# Patient Record
Sex: Female | Born: 1975 | Race: Black or African American | Hispanic: No | Marital: Single | Smoking: Never smoker
Health system: Southern US, Community
[De-identification: ages and names within clinical notes are randomized; demographics above are authoritative.]

## PROBLEM LIST (undated history)

## (undated) DIAGNOSIS — E119 Type 2 diabetes mellitus without complications: Secondary | ICD-10-CM

## (undated) DIAGNOSIS — D509 Iron deficiency anemia, unspecified: Secondary | ICD-10-CM

## (undated) DIAGNOSIS — G5600 Carpal tunnel syndrome, unspecified upper limb: Secondary | ICD-10-CM

## (undated) DIAGNOSIS — R202 Paresthesia of skin: Secondary | ICD-10-CM

## (undated) HISTORY — DX: Type 2 diabetes mellitus without complications: E11.9

## (undated) HISTORY — DX: Iron deficiency anemia, unspecified: D50.9

## (undated) HISTORY — DX: Paresthesia of skin: R20.2

---

## 2002-09-27 HISTORY — PX: TUBAL LIGATION: SHX77

## 2006-04-06 ENCOUNTER — Emergency Department (HOSPITAL_COMMUNITY): Admission: EM | Admit: 2006-04-06 | Discharge: 2006-04-06 | Payer: Self-pay | Admitting: Emergency Medicine

## 2006-04-07 ENCOUNTER — Emergency Department (HOSPITAL_COMMUNITY): Admission: EM | Admit: 2006-04-07 | Discharge: 2006-04-07 | Payer: Self-pay | Admitting: Emergency Medicine

## 2006-04-09 ENCOUNTER — Emergency Department (HOSPITAL_COMMUNITY): Admission: EM | Admit: 2006-04-09 | Discharge: 2006-04-09 | Payer: Self-pay | Admitting: Family Medicine

## 2007-03-27 ENCOUNTER — Emergency Department (HOSPITAL_COMMUNITY): Admission: EM | Admit: 2007-03-27 | Discharge: 2007-03-27 | Payer: Self-pay | Admitting: Emergency Medicine

## 2007-04-05 ENCOUNTER — Emergency Department (HOSPITAL_COMMUNITY): Admission: EM | Admit: 2007-04-05 | Discharge: 2007-04-05 | Payer: Self-pay | Admitting: Emergency Medicine

## 2007-07-31 ENCOUNTER — Emergency Department (HOSPITAL_COMMUNITY): Admission: EM | Admit: 2007-07-31 | Discharge: 2007-07-31 | Payer: Self-pay | Admitting: Emergency Medicine

## 2008-03-04 ENCOUNTER — Emergency Department (HOSPITAL_COMMUNITY): Admission: EM | Admit: 2008-03-04 | Discharge: 2008-03-04 | Payer: Self-pay | Admitting: Emergency Medicine

## 2008-06-12 ENCOUNTER — Emergency Department (HOSPITAL_COMMUNITY): Admission: EM | Admit: 2008-06-12 | Discharge: 2008-06-12 | Payer: Self-pay | Admitting: Emergency Medicine

## 2011-06-28 LAB — RAPID STREP SCREEN (MED CTR MEBANE ONLY): Streptococcus, Group A Screen (Direct): POSITIVE — AB

## 2011-07-06 LAB — COMPREHENSIVE METABOLIC PANEL
ALT: 13
BUN: 9
CO2: 27
GFR calc Af Amer: >60
GFR calc non Af Amer: >60
Glucose, Bld: 93
Potassium: 4.2
Total Bilirubin: 0.4

## 2011-07-06 LAB — URINALYSIS, ROUTINE W REFLEX MICROSCOPIC
Bilirubin Urine: NEGATIVE
Glucose, UA: NEGATIVE
Nitrite: NEGATIVE
Urobilinogen, UA: 0.2
pH: 5

## 2011-07-06 LAB — DIFFERENTIAL
Basophils Relative: 1
Eosinophils Absolute: 0.3
Eosinophils Relative: 3
Lymphs Abs: 2
Monocytes Absolute: 0.9 — ABNORMAL HIGH
Monocytes Relative: 11
Neutrophils Relative %: 64

## 2011-07-06 LAB — URINE MICROSCOPIC-ADD ON

## 2011-07-06 LAB — CBC
HCT: 38.9
Hemoglobin: 13.1
MCV: 79.8

## 2011-07-06 LAB — LIPASE, BLOOD: Lipase: 24

## 2016-04-09 ENCOUNTER — Encounter: Payer: Self-pay | Admitting: Neurology

## 2016-04-09 ENCOUNTER — Ambulatory Visit (INDEPENDENT_AMBULATORY_CARE_PROVIDER_SITE_OTHER): Payer: Managed Care, Other (non HMO) | Admitting: Neurology

## 2016-04-09 VITALS — BP 110/80 | HR 74 | Ht 65.0 in | Wt 236.1 lb

## 2016-04-09 DIAGNOSIS — R202 Paresthesia of skin: Secondary | ICD-10-CM | POA: Diagnosis not present

## 2016-04-09 DIAGNOSIS — M79631 Pain in right forearm: Secondary | ICD-10-CM | POA: Diagnosis not present

## 2016-04-09 DIAGNOSIS — M79632 Pain in left forearm: Secondary | ICD-10-CM | POA: Diagnosis not present

## 2016-04-09 MED ORDER — GABAPENTIN 300 MG PO CAPS: ORAL_CAPSULE | ORAL | Status: DC

## 2016-04-09 NOTE — Patient Instructions (Addendum)
1.  Start gabapentin 300mg  at bedtime x 3 then increase to 1 tablet twice daily 2.  EMG of the arms.  We will call you with the results 3.  Return to clinic in 3 months

## 2016-04-09 NOTE — Progress Notes (Signed)
Note routed

## 2016-04-09 NOTE — Progress Notes (Signed)
[facility] Neurology [facility] Clinic Note - Initial Visit   Date: 04/09/2016  Kathryn Hayden MRN: [id number] DOB: 09-Jan-1976   Dear Dr. Kateri PlummerMorrow:  Thank you for your kind referral of Kathryn Hayden for consultation of bilateral hand paresthesias. Although her history is well known to you, please allow us to reiterate it for the purpose of our medical record. The patient was accompanied to the clinic by self.   History of Present Illness: Kathryn Hayden is a 40 y.o. right-handed African American female with iron deficient anemia presenting for evaluation of bilateral hand paresthesias.    Starting in April 2017, she developed right sided shoulder pain, which was achy at first and then developed stinging pain.  She then developed numbness over the hands and tingling over the forearm which is worse at night time which often wakes her up from sleeping.   Her right hand locked up on one occasion which prompted her to see her PCP.  She was given tramadol and flexeril which helped her pain in her shoulder.   Currently, she has bilateral arm and hand pain and numbness.  She does not have any weakness or neck pain.  She works as a Clinical biochemistCMA.  She also complains of left ankle swelling and left knee pain.   Out-side paper records, electronic medical record, and images have been reviewed where available and summarized as:  Labs 01/2016: vitamin B12 540  Past Medical History  Diagnosis Date  . Paresthesias   . Iron deficiency anemia     No past surgical history on file.   Medications:  Outpatient Encounter Prescriptions as of 04/09/2016  Medication Sig Note  . clobetasol ointment (TEMOVATE) 0.05 % APPLY ON SKIN DAILY 04/09/2016: Received from: External Pharmacy  . cyclobenzaprine (FLEXERIL) 5 MG tablet take 1 tablet by mouth three times a day if needed 04/09/2016: Received from: External Pharmacy  . IRON PO Take 65 mg by mouth.   . traMADol (ULTRAM) 50 MG tablet Take 50 mg by mouth 2  (two) times daily. 04/09/2016: Received from: External Pharmacy  . triamcinolone cream (KENALOG) 0.1 % apply to affected area twice a day 04/09/2016: Received from: External Pharmacy   No facility-administered encounter medications on file as of 04/09/2016.     Allergies: No Known Allergies  Family History: Family History  Problem Relation Age of Onset  . Diabetes Father   . High blood pressure Mother   . High blood pressure Father     Social History: Social History  Substance Use Topics  . Smoking status: Never Smoker   . Smokeless tobacco: Never Used  . Alcohol Use: 0.0 oz/week    0 Standard drinks or equivalent per week   Social History   Social History Narrative   Lives with 2 daughters in a 3 story home.  Has 3 daughters.  Works as a LawyerCNA.  Education: some college.    Review of Systems:  CONSTITUTIONAL: No fevers, chills, night sweats, or weight loss.   EYES: No visual changes or eye pain ENT: No hearing changes.  No history of nose bleeds.   RESPIRATORY: No cough, wheezing and shortness of breath.   CARDIOVASCULAR: Negative for chest pain, and palpitations.   GI: Negative for abdominal discomfort, blood in stools or black stools.  No recent change in bowel habits.   GU:  No history of incontinence.   MUSCLOSKELETAL: No history of joint pain or swelling.  No myalgias.   SKIN: Negative for lesions, rash, and  itching.   HEMATOLOGY/ONCOLOGY: Negative for prolonged bleeding, bruising easily, and swollen nodes.  No history of cancer.   ENDOCRINE: Negative for cold or heat intolerance, polydipsia or goiter.   PSYCH:  No depression or anxiety symptoms.   NEURO: As Above.   Vital Signs:  BP 110/80 mmHg  Pulse 74  Ht  (1.651 m)  Wt 236 lb 2 oz (107.106 kg)  BMI 39.29 kg/m2  SpO2 97% Pain Scale: 0 on a scale of 0-10   General Medical Exam:   General:  Well appearing, comfortable.   Eyes/ENT: seecranial nerve examination.   Neck: No masses appreciated.  Full  range of motion without tenderness.  No carotid bruits. Respiratory:  Clear to auscultation, good air entry bilaterally.   Cardiac:  Regular rate and rhythm, no murmur.   Extremities:  No deformities, edema, or skin discoloration.  Skin:  No rashes or lesions.  Neurological Exam: MENTAL STATUS including orientation to time, place, person, recent and remote memory, attention span and concentration, language, and fund of knowledge is normal.  Speech is not dysarthric.  CRANIAL NERVES: II:  No visual field defects.  Unremarkable fundi.   III-IV-VI: Pupils equal round and reactive to light.  Normal conjugate, extra-ocular eye movements in all directions of gaze.  No nystagmus.  No ptosis.   V:  Normal facial sensation.   VII:  Normal facial symmetry and movements. VIII:  Normal hearing and vestibular function.   IX-X:  Normal palatal movement.   XI:  Normal shoulder shrug and head rotation.   XII:  Normal tongue strength and range of motion, no deviation or fasciculation.  MOTOR: She has tenderness over the right forearm extensor tendons.  No atrophy, fasciculations or abnormal movements.  No pronator drift.  Tone is normal.    Right Upper Extremity:    Left Upper Extremity:    Deltoid  5/5   Deltoid  5/5   Biceps  5/5   Biceps  5/5   Triceps  5/5   Triceps  5/5   Wrist extensors  5/5   Wrist extensors  5/5   Wrist flexors  5/5   Wrist flexors  5/5   Finger extensors  5/5   Finger extensors  5/5   Finger flexors  5/5   Finger flexors  5/5   Dorsal interossei  5-/5   Dorsal interossei  5-/5   Abductor pollicis  5/5   Abductor pollicis  5/5   Tone (Ashworth scale)  0  Tone (Ashworth scale)  0   Right Lower Extremity:    Left Lower Extremity:    Hip flexors  5/5   Hip flexors  5/5   Hip extensors  5/5   Hip extensors  5/5   Knee flexors  5/5   Knee flexors  5/5   Knee extensors  5/5   Knee extensors  5/5   Dorsiflexors  5/5   Dorsiflexors  5/5   Plantarflexors  5/5   Plantarflexors   5/5   Toe extensors  5/5   Toe extensors  5/5   Toe flexors  5/5   Toe flexors  5/5   Tone (Ashworth scale)  0  Tone (Ashworth scale)  0   MSRs:  Reflexes are 2+/4 throughout.  Plantars are down going.  SENSORY:  Reduced pin prick over the right index and middle finger, otherwise normal and symmetric perception of light touch, pinprick, vibration, and proprioception.   COORDINATION/GAIT: Normal finger-to- nose-finger.  Intact rapid  alternating movements bilaterally. Gait narrow based and stable. Tandem and stressed gait intact.    IMPRESSION/PLAN: Kathryn Hayden is a 40 year-old female referred for evaluation of bilateral arm and hand pain.  Her exam shows mild intrinsic hand weakness and pain triggered by joint movement.  Sensation is reduced over the median distribution on the right, otherwise normal.  Reflexes are also normal.  Although she may very well have carpal tunnel syndrome, it would not explain her proximal forearm pain and paresthesias.  Cervical radiculopathy is possible and NCS/EMG will be ordered to better characterize the nature of her symptoms.  The extreme tenderness of her forearm also suggests possible tendonitis or myalgia.  Her EMG will be telling. In the meantime, gabapentin 300mg  at bedtime x 3 days, then 300mg  twice daily will be started for symptom management.  Return to clinic in 3 months.  The duration of this appointment visit was 40 minutes of face-to-face time with the patient.  Greater than 50% of this time was spent in counseling, explanation of diagnosis, planning of further management, and coordination of care.   Thank you for allowing me to participate in patient's care.  If I can answer any additional questions, I would be pleased to do so.    Sincerely,    Donika K. Allena Katz, DO

## 2016-04-20 ENCOUNTER — Ambulatory Visit (INDEPENDENT_AMBULATORY_CARE_PROVIDER_SITE_OTHER): Payer: Managed Care, Other (non HMO) | Admitting: Neurology

## 2016-04-20 DIAGNOSIS — G5603 Carpal tunnel syndrome, bilateral upper limbs: Secondary | ICD-10-CM

## 2016-04-20 DIAGNOSIS — M79632 Pain in left forearm: Secondary | ICD-10-CM

## 2016-04-20 DIAGNOSIS — R202 Paresthesia of skin: Secondary | ICD-10-CM

## 2016-04-20 DIAGNOSIS — M79631 Pain in right forearm: Secondary | ICD-10-CM

## 2016-04-20 NOTE — Procedures (Addendum)
[facility] Neurology  [address] [place], [address]  [place], [place] [address] Tel: [phone] Fax:  [phone] Test Date:  04/20/2016  Patient: Kathryn Hayden DOB: August 05, 1976 Physician: Nita Sickle, DO  Sex: Female Height: 5\' 5"  Ref Phys: Nita Sickle, DO  ID#: [id number] Temp: 33.2C Technician: Judie Petit. Dean   Patient Complaints: This is a 40 year old female referred for evaluation of bilateral hand numbness and tingling.  NCV & EMG Findings: Extensive electrodiagnostic testing of the right upper extremity and additional studies of the left shows: 1. Bilateral median sensory responses show prolonged latency (R4.2, L4.9 ms) and left side also shows reduced amplitude (19.1 V).  Bilateral ulnar sensory responses are within normal limits. 2. Bilateral median motor responses show prolonged distal onset latency (R4.5, L5.0.5 ms).  Bilateral ulnar motor responses are within normal limits. 3. Sparse chronic motor axon loss changes were isolated to the left abductor pollicis brevis muscle, without accompanied active denervation.    Impression: 1. Bilateral median neuropathy, at or distal to the wrist, consistent with clinical diagnosis of carpal tunnel syndrome. Overall, these findings are moderate in degree electrically, and worse on the left. 2. There is no evidence of a cervical radiculopathy affecting the upper extremities.   ___________________________ Nita Sickle, DO    Nerve Conduction Studies Anti Sensory Summary Table   Site NR Peak (ms) Norm Peak (ms) P-T Amp (V) Norm P-T Amp  Left Median Anti Sensory (2nd Digit)  Wrist    4.9 <3.4 19.1 >20  Right Median Anti Sensory (2nd Digit)  Wrist    4.2 <3.4 21.4 >20  Left Ulnar Anti Sensory (5th Digit)  Wrist    2.8 <3.1 33.3 >12  Right Ulnar Anti Sensory (5th Digit)  Wrist    2.5 <3.1 36.4 >12   Motor Summary Table   Site NR Onset (ms) Norm Onset (ms) O-P Amp (mV) Norm O-P Amp Site1 Site2 Delta-0 (ms) Dist (cm) Vel (m/s) Norm  Vel (m/s)  Left Median Motor (Abd Poll Brev)  Wrist    5.0 <3.9 11.3 >6 Elbow Wrist 4.3 25.0 58 >50  Elbow    9.3  11.2         Right Median Motor (Abd Poll Brev)  Wrist    4.5 <3.9 11.7 >6 Elbow Wrist 4.3 25.0 58 >50  Elbow    8.8  10.9         Left Ulnar Motor (Abd Dig Minimi)  Wrist    2.4 <3.1 9.2 >7 B Elbow Wrist 3.6 21.0 58 >50  B Elbow    6.0  8.0  A Elbow B Elbow 1.5 10.0 67 >50  A Elbow    7.5  7.7         Right Ulnar Motor (Abd Dig Minimi)  Wrist    2.7 <3.1 9.2 >7 B Elbow Wrist 3.6 20.0 56 >50  B Elbow    6.3  9.0  A Elbow B Elbow 1.6 10.0 62 >50  A Elbow    7.9  8.9          EMG   Side Muscle Ins Act Fibs Psw Fasc Number Recrt Dur Dur. Amp Amp. Poly Poly. Comment  Right 1stDorInt Nml Nml Nml Nml Nml Nml Nml Nml Nml Nml Nml Nml N/A  Left Abd Poll Brev Nml Nml Nml Nml 1- Mod-R Few 1+ Few 1+ Nml Nml N/A  Right Abd Poll Brev Nml Nml Nml Nml Nml Nml Nml Nml Nml Nml Nml Nml N/A  Right  Ext Indicis Nml Nml Nml Nml Nml Nml Nml Nml Nml Nml Nml Nml N/A  Right PronatorTeres Nml Nml Nml Nml Nml Nml Nml Nml Nml Nml Nml Nml N/A  Right Biceps Nml Nml Nml Nml Nml Nml Nml Nml Nml Nml Nml Nml N/A  Right Triceps Nml Nml Nml Nml Nml Nml Nml Nml Nml Nml Nml Nml N/A  Right Deltoid Nml Nml Nml Nml Nml Nml Nml Nml Nml Nml Nml Nml N/A  Left 1stDorInt Nml Nml Nml Nml Nml Nml Nml Nml Nml Nml Nml Nml N/A  Left Ext Indicis Nml Nml Nml Nml Nml Nml Nml Nml Nml Nml Nml Nml N/A  Left PronatorTeres Nml Nml Nml Nml Nml Nml Nml Nml Nml Nml Nml Nml N/A  Left Biceps Nml Nml Nml Nml Nml Nml Nml Nml Nml Nml Nml Nml N/A  Left Triceps Nml Nml Nml Nml Nml Nml Nml Nml Nml Nml Nml Nml N/A  Left Deltoid Nml Nml Nml Nml Nml Nml Nml Nml Nml Nml Nml Nml N/A      Waveforms:

## 2016-04-20 NOTE — Progress Notes (Signed)
Done

## 2016-07-28 ENCOUNTER — Ambulatory Visit: Payer: Managed Care, Other (non HMO) | Admitting: Neurology

## 2016-08-16 ENCOUNTER — Encounter (HOSPITAL_COMMUNITY): Payer: Self-pay | Admitting: Emergency Medicine

## 2016-08-16 ENCOUNTER — Emergency Department (HOSPITAL_COMMUNITY)
Admission: EM | Admit: 2016-08-16 | Discharge: 2016-08-16 | Disposition: A | Discharge status: Home / Self Care | Payer: Managed Care, Other (non HMO) | Attending: Emergency Medicine | Admitting: Emergency Medicine

## 2016-08-16 DIAGNOSIS — Z5321 Procedure and treatment not carried out due to patient leaving prior to being seen by health care provider: Secondary | ICD-10-CM | POA: Diagnosis not present

## 2016-08-16 DIAGNOSIS — R0789 Other chest pain: Secondary | ICD-10-CM | POA: Insufficient documentation

## 2016-08-16 HISTORY — DX: Carpal tunnel syndrome, unspecified upper limb: G56.00

## 2016-08-16 NOTE — ED Notes (Signed)
PATIENT IS NO LONGER IN BATHROOM IN PEDS TRIAGE AND IS UNABLE TO BE LOCATED AT THIS TIME.  ROBIN, EMT HAD WENT TO BATHROOM SEVERAL TIMES TO CHECK ON PATIENT AND SHE SAID SHE WAS OK AND WOULD BE OUT SOON.  WHEN RN WENT TO CHECK ON PATIENT SHE IS GONE.  NURSE FIRST/REGISTRATION STATES THEY HAVE NOT SEEN PATIENT.

## 2016-08-16 NOTE — ED Notes (Signed)
PT WENT IN BATHROOM ON ARRIVAL AND HAS BEEN TALKING ON PHONE IN BATHROOM.  PT STATES SHE IS COMING OUT BUT REMAINS IN BATHROOM ON PHONE.  STAFF WAITING ON PATIENT SO THAT EKG AND LABS CAN BE COMPLETED.

## 2016-08-16 NOTE — ED Triage Notes (Signed)
Report from [facility]> pt c/o tightness to center of chest that started 5 min after she started smoking marijuana.  Denies sob, nausea, and vomiting.  Pt reports she has carpal tunnel and doesn't like taking her pain medication so thought she would try marijuana for the first time since she was 13.

## 2016-11-12 ENCOUNTER — Ambulatory Visit: Payer: Managed Care, Other (non HMO) | Admitting: Neurology

## 2018-01-04 ENCOUNTER — Encounter: Payer: Medicaid Other | Attending: Family Medicine | Admitting: Skilled Nursing Facility1

## 2018-01-04 ENCOUNTER — Encounter: Payer: Self-pay | Admitting: Skilled Nursing Facility1

## 2018-01-04 DIAGNOSIS — R7303 Prediabetes: Secondary | ICD-10-CM | POA: Insufficient documentation

## 2018-01-04 DIAGNOSIS — Z713 Dietary counseling and surveillance: Secondary | ICD-10-CM | POA: Insufficient documentation

## 2018-01-04 DIAGNOSIS — E119 Type 2 diabetes mellitus without complications: Secondary | ICD-10-CM

## 2018-01-04 NOTE — Progress Notes (Signed)
  Assessment:  Primary concerns today: diabetes.   Pt states: I'm fine, I'm borderline diabetic, I lost the weight so I am fine and do not need to be here. Pt states she is only here because her doctor kept pestering her. Pt states she onlyI eats one meal a day and lays down around 7pm and wakes up at 10pm. Pt states she sleeps 2 hours here and there. 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 Pt states she wants to spend time with her girls and work.  After pt was educated on the changes to be made for diabetes control and health the pt states she will figure it out but there is no way I am eating 3 meals a day.  MEDICATIONS: See List   DIETARY INTAKE:  Usual eating pattern includes 1 meals and 1 snacks per day.  Everyday foods include none stated.  Avoided foods include none stated.    24-hr recall:  B ( AM): fruit Snk ( AM): L ( 12PM): meat, green beans, baked mac n cheese Snk ( PM): D ( PM):  Snk ( PM):  Beverages: water, sweet tea  Usual physical activity: ADL's  Estimated energy needs: 1500 calories 170 g carbohydrates 112 g protein 42 g fat  Progress Towards Goal(s):  In progress.   Nutritional Diagnosis:  NB-1.1 Food and nutrition-related knowledge deficit As related to newly diagnosed diabetes.  As evidenced by pt report, A1C 6.6.    Intervention:  Nutrition counseling for diabetes. Pt was educated on the diagnosis range of diabetes, the physiologic mechanism, and how to eat for diabetes control.    Teaching Method Utilized:  Visual Auditory Hands on  Handouts given during visit include:  Living well with diabetes  Yellow card  Barriers to learning/adherence to lifestyle change: pre contemplative stage of change   Demonstrated degree of understanding via:  Teach Back   Monitoring/Evaluation:  Dietary intake, exercise,, and body weight prn.

## 2018-06-21 ENCOUNTER — Encounter: Payer: Self-pay | Admitting: Neurology

## 2018-06-21 ENCOUNTER — Ambulatory Visit (INDEPENDENT_AMBULATORY_CARE_PROVIDER_SITE_OTHER): Payer: Medicaid Other | Admitting: Neurology

## 2018-06-21 VITALS — BP 120/84 | HR 82 | Ht 64.0 in | Wt 259.5 lb

## 2018-06-21 DIAGNOSIS — H539 Unspecified visual disturbance: Secondary | ICD-10-CM

## 2018-06-21 DIAGNOSIS — G5603 Carpal tunnel syndrome, bilateral upper limbs: Secondary | ICD-10-CM

## 2018-06-21 DIAGNOSIS — R51 Headache with orthostatic component, not elsewhere classified: Secondary | ICD-10-CM

## 2018-06-21 DIAGNOSIS — S39012A Strain of muscle, fascia and tendon of lower back, initial encounter: Secondary | ICD-10-CM

## 2018-06-21 DIAGNOSIS — R519 Headache, unspecified: Secondary | ICD-10-CM

## 2018-06-21 DIAGNOSIS — R202 Paresthesia of skin: Secondary | ICD-10-CM

## 2018-06-21 MED ORDER — CYCLOBENZAPRINE HCL 5 MG PO TABS: 5.0000 mg | ORAL_TABLET | Freq: Every evening | ORAL | 1 refills | Status: DC | PRN

## 2018-06-21 MED ORDER — PREDNISONE 10 MG (21) PO TBPK: ORAL_TABLET | ORAL | 0 refills | Status: DC

## 2018-06-21 NOTE — Progress Notes (Signed)
Follow-up Visit   Date: 06/21/18    Kathryn Hayden MRN: [id number] DOB: 04/29/1976   Interim History: Kathryn Hayden is a 42 y.o. right-handed African American female with bilateral carpal tunnel syndrome returning to the clinic for with new complains of headache and low back pain.  The patient was accompanied to the clinic by self.  UPDATE 06/21/2018:  Starting around April 2019, she began having bifrontal headaches, described as throbbing.  Pain lasts several minutes and then eases, but will recur.  Headaches occur suddenly without warning.  Headaches occur about 3 times per week, lasting anywhere from minutes to 2 days.  They are worse at night. She does not have nausea or vomiting. Headaches are not worse with activity, sneezing, coughing, or bending.    She saw her PCP who started topiramate and she is taking 50mg  daily.  She has noticed any benefit at this dose.  She has blurred vision with headaches.  She does not have numbness, tingling to weakness.    She also complains of throbbing pain in her low back, which goes into into her left hip.  It started a month about without any preceding injury. No numbness or tingling, weakness, or falls.  She has tenderness over the left flank and buttocks.  She was previously seen in 2017 for bilateral hand paresthesias, diagnosed with moderate CTS.  She was evaluated for hand surgery (Dr. Melvyn Novas) and due to cost, she did not proceed with CTS release.  She is wearing wrist splints and occasionally takes gabapentin 300mg  at bedtime  Medications:  Current Outpatient Medications on File Prior to Visit  Medication Sig Dispense Refill  . gabapentin (NEURONTIN) 300 MG capsule Take 1 tablet at bedtime x 3 days, then increase to 1 tablet twice daily. 60 capsule 5  . topiramate (TOPAMAX) 25 MG tablet TK 1 T PO QD HS  1  . triamcinolone cream (KENALOG) 0.1 % apply to affected area twice a day  0   No current facility-administered medications on  file prior to visit.     Allergies:  Allergies  Allergen Reactions  . Acetaminophen Itching    Review of Systems:  CONSTITUTIONAL: No fevers, chills, night sweats, or weight loss.  EYES: No visual changes or eye pain ENT: No hearing changes.  No history of nose bleeds.   RESPIRATORY: No cough, wheezing and shortness of breath.   CARDIOVASCULAR: Negative for chest pain, and palpitations.   GI: Negative for abdominal discomfort, blood in stools or black stools.  No recent change in bowel habits.   GU:  No history of incontinence.   MUSCLOSKELETAL: +history of joint pain or swelling.  No myalgias.   SKIN: Negative for lesions, rash, and itching.   ENDOCRINE: Negative for cold or heat intolerance, polydipsia or goiter.   PSYCH:  No depression or anxiety symptoms.   NEURO: As Above.   Vital Signs:  BP 120/84   Pulse 82   Ht 5\' 4"  (1.626 m)   Wt 259 lb 8 oz (117.7 kg)   SpO2 100%   BMI 44.54 kg/m   General Medical Exam:   General:  Well appearing, comfortable  Eyes/ENT: see cranial nerve examination.   Neck: No masses appreciated.  Full range of motion without tenderness.  No carotid bruits. Respiratory:  Clear to auscultation, good air entry bilaterally.   Cardiac:  Regular rate and rhythm, no murmur.   Ext:  No edema  Neurological Exam: MENTAL STATUS including orientation to time,  place, person, recent and remote memory, attention span and concentration, language, and fund of knowledge is normal.  Speech is not dysarthric.  CRANIAL NERVES: No visual field defects. Normal fundoscopic exam.  Pupils equal round and reactive to light.  Normal conjugate, extra-ocular eye movements in all directions of gaze.  No ptosis.  Face is symmetric. Palate elevates symmetrically.  Tongue is midline.  MOTOR:  Motor strength is 5/5 in all extremities.  No atrophy, fasciculations or abnormal movements.  No pronator drift.  Tone is normal.  She does have tenderness over the left flank and  paraspinal region, which is worse with leg extension and hip flexion  MSRs:  Reflexes are 1+/4 throughout  SENSORY:  Intact to vibration, temperature, and pinprick throughout.  COORDINATION/GAIT:  Normal finger-to- nose-finger.  Intact rapid alternating movements bilaterally.  Gait narrow based and stable.   Data: NCS/EMG of bilateral arms 04/20/2016: 1. Bilateral median neuropathy, at or distal to the wrist, consistent with clinical diagnosis of carpal tunnel syndrome. Overall, these findings are moderate in degree electrically, and worse on the left. 2. There is no evidence of a cervical radiculopathy affecting the upper extremities.   IMPRESSION/PLAN: 1.  New onset headache.  With headaches waking her up from sleeping and associated with vision changes, she does warrant intracranial imaging with MRAI/A head.  Characteristics of her headaches are not classic for migraine.  She does not have any prior history of migraines and there is no relief with topiramate 50 mg daily.  In the meantime, continue topiramate 50 mg daily.  She does not have any plans for pregnancy.  2.  Lumbar strain affecting the left side - new.  Start prednisone taper course over one week and Flexeril 5 mg at bedtime as needed.  Start physical therapy.  No signs of canal or foraminal stenosis based on history or exam.  3.  Bilateral carpal tunnel syndrome, moderate in degree electrically.  She was previously evaluated for CTS release, however due to cost, she did not proceed with this.  Continue to wear wrist splints daily.  She has been using gabapentin 300 mg at bedtime sparingly, but complains of increased sedation.  It is okay to stop this medication for now, as to avoid polypharmacy.  Return to clinic in 3 months    Thank you for allowing me to participate in patient's care.  If I can answer any additional questions, I would be pleased to do so.    Sincerely,    Donika K. Allena Katz, DO

## 2018-06-21 NOTE — Patient Instructions (Addendum)
MRI brain without contrast  MRA brain  Start physical therapy for low back pain  Start prednisone taper pack  Start flexeril 5mg  at bedtime  Continue topiramate 50mg  daily  Return to clinic in 4 months

## 2018-07-07 ENCOUNTER — Other Ambulatory Visit: Payer: Managed Care, Other (non HMO)

## 2018-07-19 ENCOUNTER — Telehealth: Payer: Self-pay | Admitting: *Deleted

## 2018-07-19 NOTE — Telephone Encounter (Signed)
Patient had a no show with [facility] 06/28/18 and has not answered their calls.

## 2018-07-21 ENCOUNTER — Other Ambulatory Visit: Payer: Managed Care, Other (non HMO)

## 2018-07-25 ENCOUNTER — Ambulatory Visit
Admission: RE | Admit: 2018-07-25 | Discharge: 2018-07-25 | Disposition: A | Discharge status: Home / Self Care | Payer: Managed Care, Other (non HMO) | Source: Ambulatory Visit | Attending: Neurology | Admitting: Neurology

## 2018-07-25 ENCOUNTER — Ambulatory Visit
Admission: RE | Admit: 2018-07-25 | Discharge: 2018-07-25 | Disposition: A | Discharge status: Home / Self Care | Payer: Medicaid Other | Source: Ambulatory Visit | Attending: Neurology | Admitting: Neurology

## 2018-07-25 DIAGNOSIS — H539 Unspecified visual disturbance: Secondary | ICD-10-CM

## 2018-07-25 DIAGNOSIS — R51 Headache with orthostatic component, not elsewhere classified: Secondary | ICD-10-CM

## 2018-07-25 DIAGNOSIS — S39012A Strain of muscle, fascia and tendon of lower back, initial encounter: Secondary | ICD-10-CM

## 2018-07-25 DIAGNOSIS — G5603 Carpal tunnel syndrome, bilateral upper limbs: Secondary | ICD-10-CM

## 2018-07-26 ENCOUNTER — Telehealth: Payer: Self-pay | Admitting: *Deleted

## 2018-07-26 NOTE — Telephone Encounter (Signed)
-----   Message from [name], DO sent at 07/26/2018 12:58 PM EDT ----- Please inform patient that her MRI brain and vessel imaging is normal.  Nothing worrisome causing headaches, continue topiramate. Thanks.

## 2018-07-26 NOTE — Telephone Encounter (Signed)
Left message giving patient results and instructions.   

## 2018-10-05 NOTE — Progress Notes (Deleted)
Follow-up Visit   Date: 10/05/18    Kathryn Hayden MRN: [id number] DOB: Jan 25, 1976   Interim History: Kathryn Hayden is a 43 y.o. right-handed African American female with bilateral carpal tunnel syndrome returning to the clinic for with ***.  The patient was accompanied to the clinic by self.  UPDATE 06/21/2018:  Starting around April 2019, she began having bifrontal headaches, described as throbbing.  Pain lasts several minutes and then eases, but will recur.  Headaches occur suddenly without warning.  Headaches occur about 3 times per week, lasting anywhere from minutes to 2 days.  They are worse at night. She does not have nausea or vomiting. Headaches are not worse with activity, sneezing, coughing, or bending.    She saw her PCP who started topiramate and she is taking 50mg  daily.  She has noticed any benefit at this dose.  She has blurred vision with headaches.  She does not have numbness, tingling to weakness.    She also complains of throbbing pain in her low back, which goes into into her left hip.  It started a month about without any preceding injury. No numbness or tingling, weakness, or falls.  She has tenderness over the left flank and buttocks.  She was previously seen in 2017 for bilateral hand paresthesias, diagnosed with moderate CTS.  She was evaluated for hand surgery (Dr. Melvyn Novas) and due to cost, she did not proceed with CTS release.  She is wearing wrist splints and occasionally takes gabapentin 300mg  at bedtime  UPDATE 10/05/2018: At her last visit in September 2019, she had new complaints of headache and low back pain.  Headaches have since***she completed physical therapy and reports***  Medications:  Current Outpatient Medications on File Prior to Visit  Medication Sig Dispense Refill  . cyclobenzaprine (FLEXERIL) 5 MG tablet Take 1 tablet (5 mg total) by mouth at bedtime as needed for muscle spasms. 30 tablet 1  . gabapentin (NEURONTIN) 300 MG capsule Take  1 tablet at bedtime x 3 days, then increase to 1 tablet twice daily. 60 capsule 5  . predniSONE (STERAPRED UNI-PAK 21 TAB) 10 MG (21) TBPK tablet Take 6 tablets on day 1, then 5 tablets on day 2, and continue to taper by one tablet each day. 21 tablet 0  . topiramate (TOPAMAX) 25 MG tablet TK 1 T PO QD HS  1  . triamcinolone cream (KENALOG) 0.1 % apply to affected area twice a day  0   No current facility-administered medications on file prior to visit.     Allergies:  Allergies  Allergen Reactions  . Acetaminophen Itching    Review of Systems:  CONSTITUTIONAL: No fevers, chills, night sweats, or weight loss.  EYES: No visual changes or eye pain ENT: No hearing changes.  No history of nose bleeds.   RESPIRATORY: No cough, wheezing and shortness of breath.   CARDIOVASCULAR: Negative for chest pain, and palpitations.   GI: Negative for abdominal discomfort, blood in stools or black stools.  No recent change in bowel habits.   GU:  No history of incontinence.   MUSCLOSKELETAL: +history of joint pain or swelling.  No myalgias.   SKIN: Negative for lesions, rash, and itching.   ENDOCRINE: Negative for cold or heat intolerance, polydipsia or goiter.   PSYCH:  No depression or anxiety symptoms.   NEURO: As Above.   Vital Signs:  There were no vitals taken for this visit.  General Medical Exam:   General:  Well  appearing, comfortable  Eyes/ENT: see cranial nerve examination.   Neck: No masses appreciated.  Full range of motion without tenderness.  No carotid bruits. Respiratory:  Clear to auscultation, good air entry bilaterally.   Cardiac:  Regular rate and rhythm, no murmur.   Ext:  No edema  Neurological Exam: MENTAL STATUS including orientation to time, place, person, recent and remote memory, attention span and concentration, language, and fund of knowledge is normal.  Speech is not dysarthric.  CRANIAL NERVES: No visual field defects. Normal fundoscopic exam.  Pupils equal  round and reactive to light.  Normal conjugate, extra-ocular eye movements in all directions of gaze.  No ptosis.  Face is symmetric. Palate elevates symmetrically.  Tongue is midline.  MOTOR:  Motor strength is 5/5 in all extremities.  No atrophy, fasciculations or abnormal movements.  No pronator drift.  Tone is normal.  She does have tenderness over the left flank and paraspinal region, which is worse with leg extension and hip flexion  MSRs:  Reflexes are 1+/4 throughout  SENSORY:  Intact to vibration, temperature, and pinprick throughout.  COORDINATION/GAIT:  Normal finger-to- nose-finger.  Intact rapid alternating movements bilaterally.  Gait narrow based and stable.   Data: NCS/EMG of bilateral arms 04/20/2016: 1. Bilateral median neuropathy, at or distal to the wrist, consistent with clinical diagnosis of carpal tunnel syndrome. Overall, these findings are moderate in degree electrically, and worse on the left. 2. There is no evidence of a cervical radiculopathy affecting the upper extremities.  MRI/A brain wo contrast 07/26/2018:  Negative MRI head, Negative MRA head  IMPRESSION/PLAN: 1.  ***  2.  Chronic daily headache.  Intracranial imaging and vessel imaging is normal.  She remains on topiramate 50 mg daily.    3.  Lumbar strain, improved with physical therapy.  Exam remains benign.***  4.  Bilateral carpal tunnel syndrome (moderate) patient did not want to proceed with surgery due to cost.  Conservative therapies with using wrist splint recommended.    Return to clinic in 3 months    Thank you for allowing me to participate in patient's care.  If I can answer any additional questions, I would be pleased to do so.    Sincerely,     K. Allena Katz, DO

## 2018-10-06 ENCOUNTER — Ambulatory Visit: Payer: Medicaid Other | Admitting: Neurology

## 2018-10-20 NOTE — Progress Notes (Signed)
Follow-up Visit   Date: 10/23/18    Kathryn Hayden MRN: [id number] DOB: 01/10/1976   Interim History: Kathryn Hayden is a 43 y.o. right-handed African American female with bilateral carpal tunnel syndrome returning to the clinic for with chronic daily headaches.  The patient was accompanied to the clinic by self.  UPDATE 06/21/2018:  Starting around April 2019, she began having bifrontal headaches, described as throbbing.  Pain lasts several minutes and then eases, but will recur.  Headaches occur suddenly without warning.  Headaches occur about 3 times per week, lasting anywhere from minutes to 2 days.  They are worse at night. She does not have nausea or vomiting. Headaches are not worse with activity, sneezing, coughing, or bending.    She saw her PCP who started topiramate and she is taking 50mg  daily.  She has noticed any benefit at this dose.  She has blurred vision with headaches.  She does not have numbness, tingling to weakness.    She also complains of throbbing pain in her low back, which goes into into her left hip.  It started a month about without any preceding injury. No numbness or tingling, weakness, or falls.  She has tenderness over the left flank and buttocks.  She was previously seen in 2017 for bilateral hand paresthesias, diagnosed with moderate CTS.  She was evaluated for hand surgery (Dr. Melvyn Novasrtmann) and due to cost, she did not proceed with CTS release.  She is wearing wrist splints and occasionally takes gabapentin 300mg  at bedtime  UPDATE 10/20/2018: At her last visit in September 2019, she had new complaints of headache and low back pain.  She is here to review the results of her MRI/A head which was normal.  Her headaches are doing much better, now occurring about once per month and well-controlled on topiramate 50mg  daily.  Her low back pain has also significantly improved and she takes flexeril 5mg  at bedtime only as needed.  No new complaints.   Medications:   Current Outpatient Medications on File Prior to Visit  Medication Sig Dispense Refill  . gabapentin (NEURONTIN) 300 MG capsule Take 1 tablet at bedtime x 3 days, then increase to 1 tablet twice daily. 60 capsule 5  . topiramate (TOPAMAX) 25 MG tablet TK 1 T PO QD HS  1   No current facility-administered medications on file prior to visit.     Allergies:  Allergies  Allergen Reactions  . Acetaminophen Itching    Review of Systems:  CONSTITUTIONAL: No fevers, chills, night sweats, or weight loss.  EYES: No visual changes or eye pain ENT: No hearing changes.  No history of nose bleeds.   RESPIRATORY: No cough, wheezing and shortness of breath.   CARDIOVASCULAR: Negative for chest pain, and palpitations.   GI: Negative for abdominal discomfort, blood in stools or black stools.  No recent change in bowel habits.   GU:  No history of incontinence.   MUSCLOSKELETAL: +history of joint pain or swelling.  No myalgias.   SKIN: Negative for lesions, rash, and itching.   ENDOCRINE: Negative for cold or heat intolerance, polydipsia or goiter.   PSYCH:  No depression or anxiety symptoms.   NEURO: As Above.   Vital Signs:  BP 100/64   Pulse 78   Ht 5\' 4"  (1.626 m)   Wt 237 lb 3 oz (107.6 kg)   SpO2 98%   BMI 40.71 kg/m   General Medical Exam:   General:  Well appearing, comfortable  Eyes/ENT: see cranial nerve examination.   Neck: No masses appreciated.  Full range of motion without tenderness.  No carotid bruits. Respiratory:  Clear to auscultation, good air entry bilaterally.   Cardiac:  Regular rate and rhythm, no murmur.   Ext:  No edema  Neurological Exam: MENTAL STATUS including orientation to time, place, person, recent and remote memory, attention span and concentration, language, and fund of knowledge is normal.  Speech is not dysarthric.  CRANIAL NERVES:  Pupils equal round and reactive to light.  Normal conjugate, extra-ocular eye movements in all directions of gaze.  No  ptosis.  Face is symmetric.   MOTOR:  Motor strength is 5/5 in all extremities. No pronator drift.  Tone is normal.    COORDINATION/GAIT:   Gait narrow based and stable.   Data: NCS/EMG of bilateral arms 04/20/2016: 1. Bilateral median neuropathy, at or distal to the wrist, consistent with clinical diagnosis of carpal tunnel syndrome. Overall, these findings are moderate in degree electrically, and worse on the left. 2. There is no evidence of a cervical radiculopathy affecting the upper extremities.  MRI/A brain wo contrast 07/26/2018:  Negative MRI head, Negative MRA head  IMPRESSION/PLAN: 1.  Chronic daily headache.  Intracranial imaging and vessel imaging is normal.  Headaches are well controlled on topiramate 50 mg daily.    2.  Lumbar strain, improved with physical therapy.  Exam remains benign.  Ok to take flexeril 5mg  at bedtime as needed  3.  Bilateral carpal tunnel syndrome (moderate) patient did not want to proceed with surgery due to cost.  Conservative therapies with using wrist splint recommended.  Return to clinic as needed      Thank you for allowing me to participate in patient's care.  If I can answer any additional questions, I would be pleased to do so.    Sincerely,    Donika K. Allena KatzPatel, DO

## 2018-10-23 ENCOUNTER — Ambulatory Visit (INDEPENDENT_AMBULATORY_CARE_PROVIDER_SITE_OTHER): Payer: Medicaid Other | Admitting: Neurology

## 2018-10-23 ENCOUNTER — Ambulatory Visit: Payer: Managed Care, Other (non HMO) | Admitting: Neurology

## 2018-10-23 ENCOUNTER — Encounter: Payer: Self-pay | Admitting: Neurology

## 2018-10-23 VITALS — BP 100/64 | HR 78 | Ht 64.0 in | Wt 237.2 lb

## 2018-10-23 DIAGNOSIS — S39012S Strain of muscle, fascia and tendon of lower back, sequela: Secondary | ICD-10-CM | POA: Diagnosis not present

## 2018-10-23 DIAGNOSIS — R51 Headache: Secondary | ICD-10-CM

## 2018-10-23 DIAGNOSIS — G5603 Carpal tunnel syndrome, bilateral upper limbs: Secondary | ICD-10-CM

## 2018-10-23 DIAGNOSIS — R519 Headache, unspecified: Secondary | ICD-10-CM

## 2018-10-23 MED ORDER — CYCLOBENZAPRINE HCL 5 MG PO TABS: 5.0000 mg | ORAL_TABLET | Freq: Every evening | ORAL | 3 refills | Status: DC | PRN

## 2018-10-23 NOTE — Patient Instructions (Signed)
Return to clinic as needed

## 2019-02-16 IMAGING — MR MR HEAD W/O CM
10 series · 44 of 48 positions shown · non-contrast
Comparison: None.

CLINICAL DATA: Positional headache.  Lumbar strain

EXAM:
MRI HEAD WITHOUT CONTRAST
MRA HEAD WITHOUT CONTRAST
TECHNIQUE: Multiplanar, multiecho pulse sequences of the brain and surrounding
structures were obtained without intravenous contrast. Angiographic
images of the head were obtained using MRA technique without
contrast.

[Series 3: tof_3d_multi-slab new · axial · 0.7mm · 0.35mm/px · z∈[-56,+70]mm · 11 of 182 slices shown]
[im 1/182]
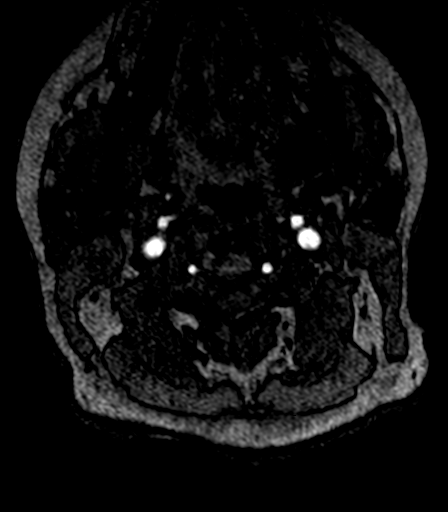
[im 19/182]
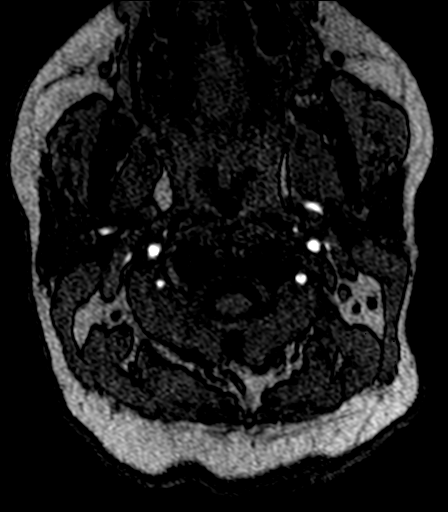
[im 37/182]
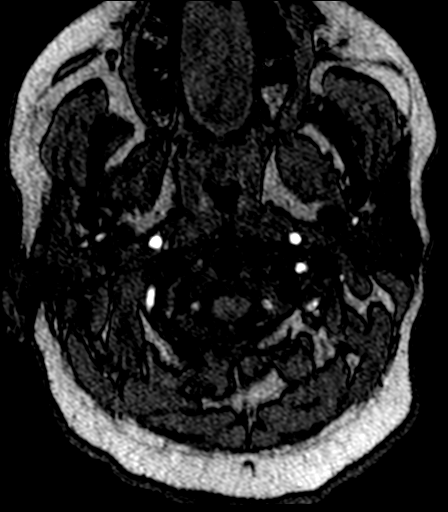
[im 55/182]
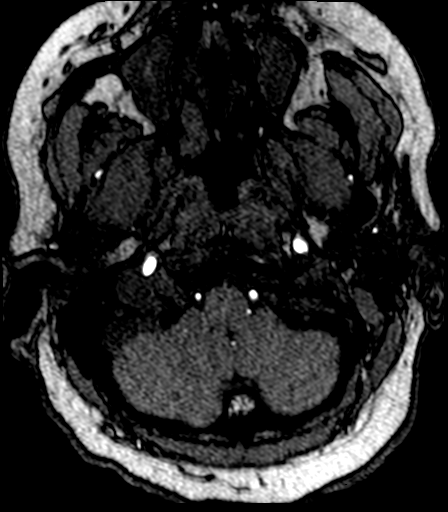
[im 73/182]
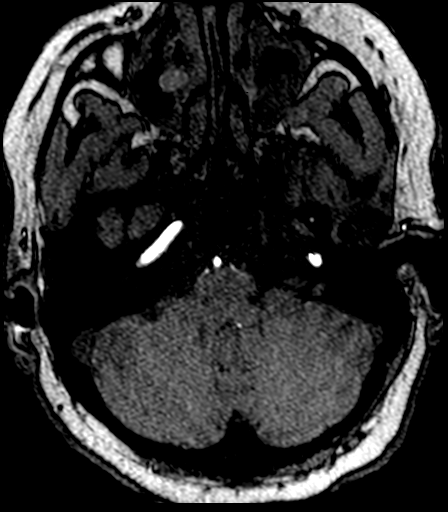
[im 91/182]
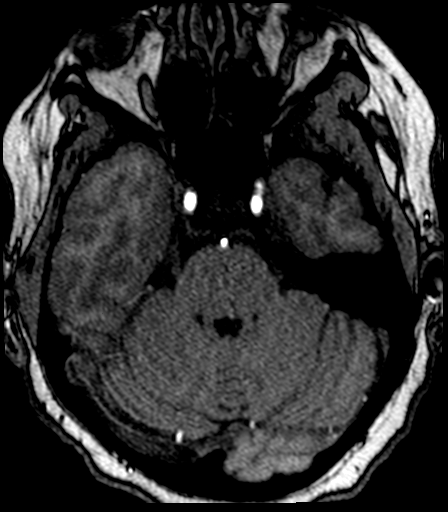
[im 109/182]
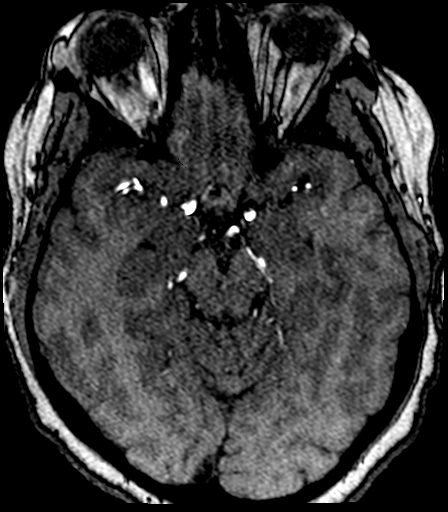
[im 127/182]
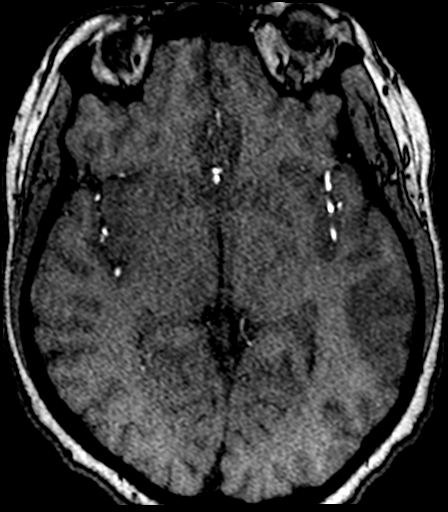
[im 145/182]
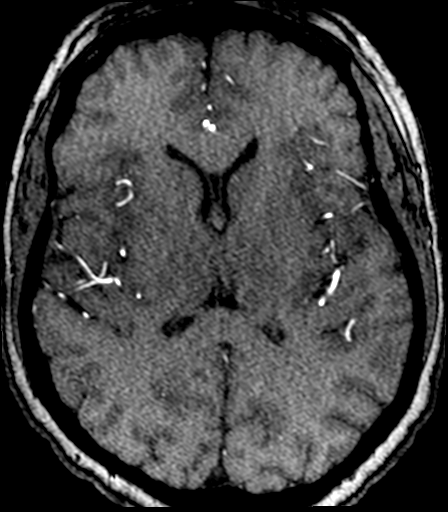
[im 163/182]
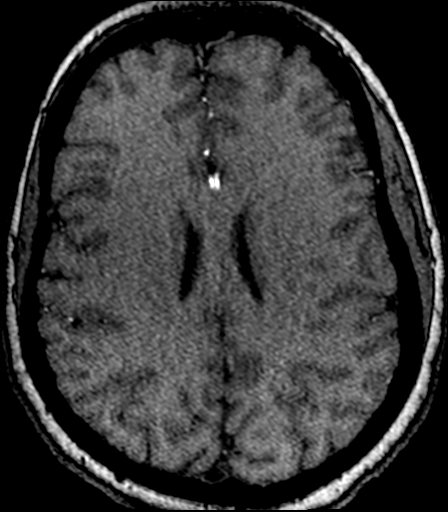
[im 182/182]
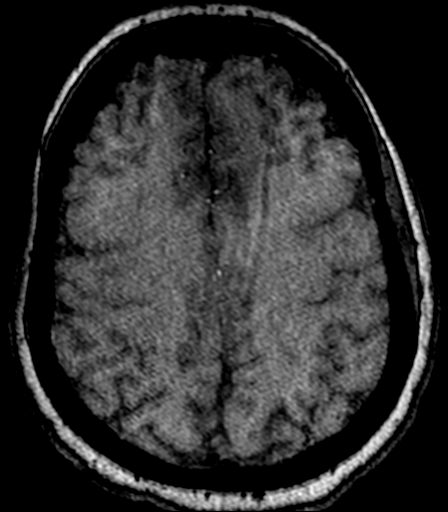

[Series 7: t1_se_sag · sagittal · 5.0mm · 0.45mm/px · 2 of 24 slices shown]
[im 1/24]
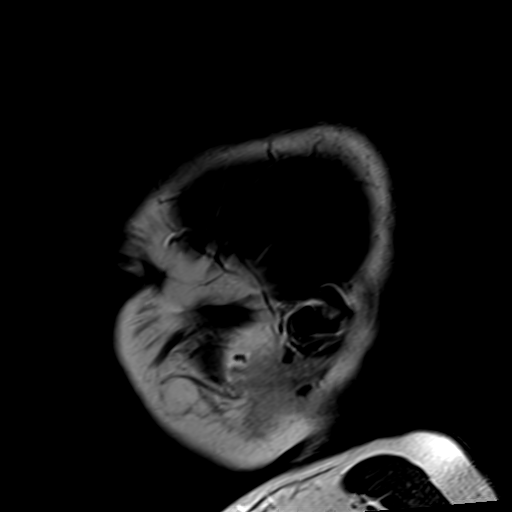
[im 24/24]
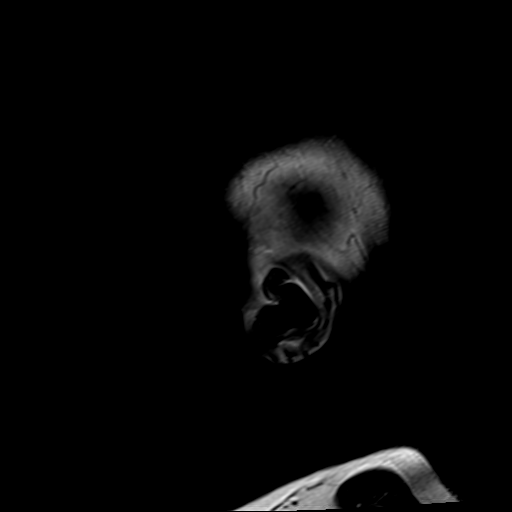

[Series 9: ep2d_diff_(id)_trace · axial · 3.0mm · 1.80mm/px · z∈[-27,+114]mm · 6 of 95 slices shown]
[im 1/95]
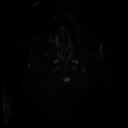
[im 19/95]
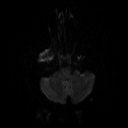
[im 38/95]
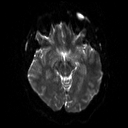
[im 57/95]
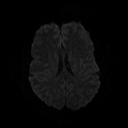
[im 76/95]
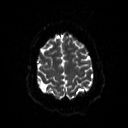
[im 95/95]
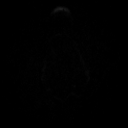

[Series 10: ep2d_diff_(id)_trace_adc · axial · 3.0mm · 1.80mm/px · z∈[-27,+114]mm · 3 of 48 slices shown]
[im 1/48]
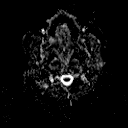
[im 24/48]
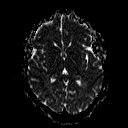
[im 48/48]
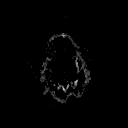

[Series 12: ep2d_diff_cor · coronal · 5.0mm · 1.77mm/px · 4 of 53 slices shown]
[im 1/53]
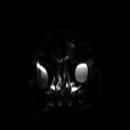
[im 18/53]
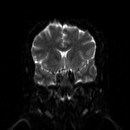
[im 35/53]
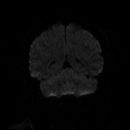
[im 53/53]
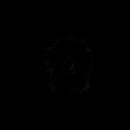

[Series 13: ep2d_diff_cor_adc · coronal · 5.0mm · 1.77mm/px · 2 of 27 slices shown]
[im 1/27]
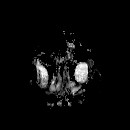
[im 27/27]
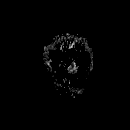

[Series 16: swi_images · axial · 2.0mm · 0.90mm/px · z∈[-35,+123]mm · 5 of 80 slices shown]
[im 1/80]
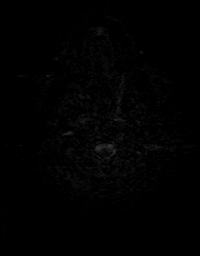
[im 20/80]
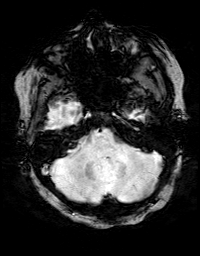
[im 40/80]
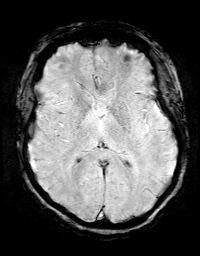
[im 60/80]
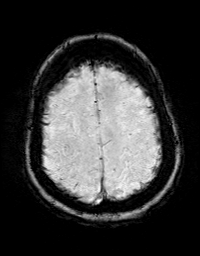
[im 80/80]
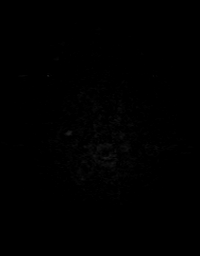

[Series 17: FLAIR · axial · 3.0mm · 0.43mm/px · z∈[-34,+122]mm · 2 of 27 slices shown]
[im 1/27]
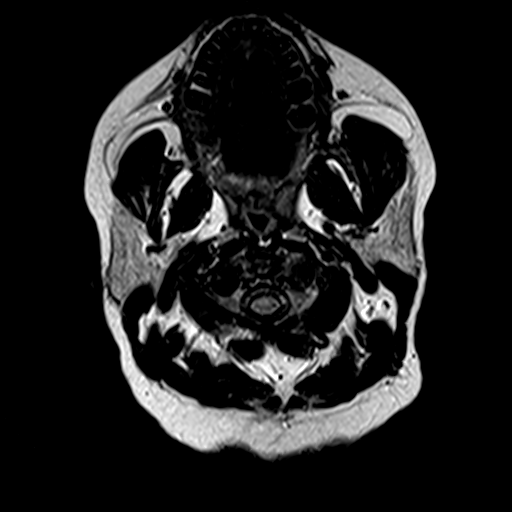
[im 27/27]
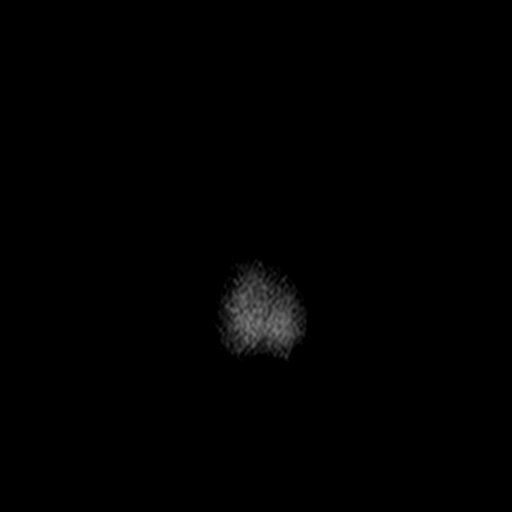

[Series 22: t2_tse_tra_512 · axial · 5.0mm · 0.60mm/px · z∈[-25,+113]mm · 2 of 24 slices shown]
[im 1/24]
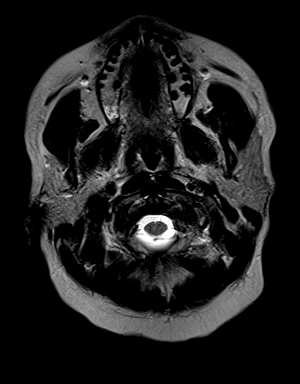
[im 24/24]
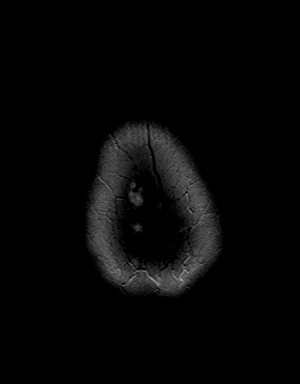

[Series 23: t1_mpr_tra · axial · 1.0mm · 0.72mm/px · z∈[-36,+91]mm · 7 of 160 slices shown]
[im 1/160]
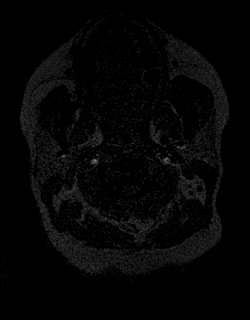
[im 32/160]
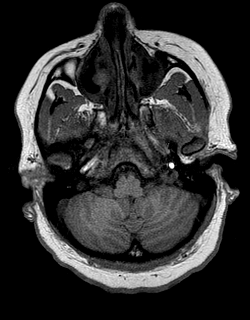
[im 48/160]
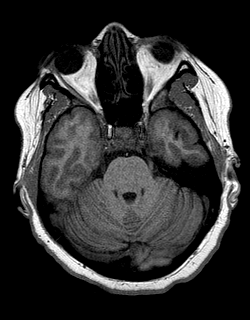
[im 64/160]
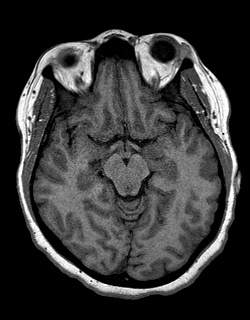
[im 96/160]
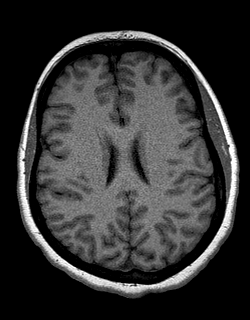
[im 112/160]
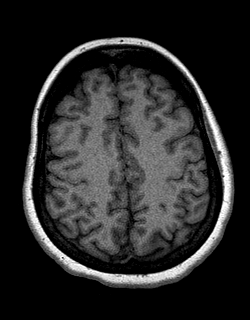
[im 128/160]
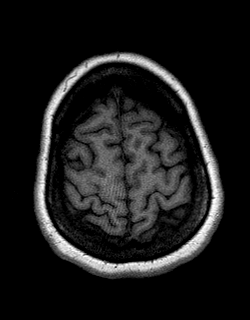

[44 of 48 positions shown; findings below may reference images not displayed]

FINDINGS: MRI HEAD FINDINGS

Brain: No acute infarction, hemorrhage, hydrocephalus, extra-axial
collection or mass lesion. Coronal T2 images inadvertently not
performed.

Vascular: Normal arterial flow voids

Skull and upper cervical spine: Negative

Sinuses/Orbits: Moderate mucosal edema paranasal sinuses. Negative
orbit

Other: None

MRA HEAD FINDINGS

Both vertebral arteries patent to the basilar. Left PICA patent.
Right PICA not visualized. AICA, superior cerebellar, posterior
cerebral arteries patent bilaterally. Patent posterior communicating
artery bilaterally.

Internal carotid artery widely patent bilaterally. Anterior and
middle cerebral arteries widely patent bilaterally without stenosis.

Negative for aneurysm.
IMPRESSION: Negative MRI head

Negative MRA head

## 2019-10-19 ENCOUNTER — Telehealth (INDEPENDENT_AMBULATORY_CARE_PROVIDER_SITE_OTHER): Payer: Medicaid Other | Admitting: Neurology

## 2019-10-19 ENCOUNTER — Other Ambulatory Visit: Payer: Self-pay

## 2019-10-19 DIAGNOSIS — G5603 Carpal tunnel syndrome, bilateral upper limbs: Secondary | ICD-10-CM | POA: Diagnosis not present

## 2019-10-19 DIAGNOSIS — R519 Headache, unspecified: Secondary | ICD-10-CM

## 2019-10-19 MED ORDER — TOPIRAMATE 50 MG PO TABS: ORAL_TABLET | ORAL | 3 refills | Status: AC

## 2019-10-19 NOTE — Progress Notes (Signed)
   Due to the COVID-19 crisis, this virtual check-in visit was done via telephone from my office and it was initiated and consent given by this patient and or family.   Telephone (Audio) Visit The purpose of this telephone visit is to provide medical care while limiting exposure to the novel coronavirus.    Consent was obtained for telephone visit and initiated by pt/family:  Yes.   Answered questions that patient had about telehealth interaction:  Yes.   I discussed the limitations, risks, security and privacy concerns of performing an evaluation and management service by telephone. I also discussed with the patient that there may be a patient responsible charge related to this service. The patient expressed understanding and agreed to proceed.  Pt location: Home Physician Location: office Name of referring provider:  Koirala, Dibas, MD I connected with .Kathryn Hayden at patients initiation/request on 10/19/2019 at  1:30 PM EST by telephone and verified that I am speaking with the correct person using two identifiers.  Pt MRN:  [id number] Pt DOB:  06-Jul-1976   History of Present Illness: This is a 44 year-old female returning for follow-up of migraine and carpal tunnel syndrome.  Her headaches were well-controlled and occurring about twice per week around November/December 2020 on topiramate 50mg  daily.  Headaches starting increasing in frequency and intensity, almost occurring daily.  Pain is throbbing and band-like around the forehead and base of the head. She endorses mild nausea.   She has bilateral carpal tunnel syndrome and reports having increased left wrist pain with associated numbness in both hands.  She is noticing some weakness in the left hand.  Previously, she was recommended CTS release by Dr. , but due to out-of-pocket cost with her prior insurance, she did not proceed.  She is now on medicaid and would like to be reconsidered.  She is using her wrist splints and  tried gabapentin, but there was no benefit.   Assessment and Plan:   1.  Chronic daily headache, worsening.  MRI/A brain in 2019 is normal. Increase topiramate to 75mg  daily (25mg  in AM and 50mg  at bedtime)  2.  Bilateral carpal tunnel syndrome (moderate), worsening especially in the left hand.  She has since changed insurance and now has medicaid, so would like to be reconsidered for cortisone injection and/orsurgical management.  I will have her follow-up with Dr. 2020.   Follow Up Instructions:   I discussed the assessment and treatment plan with the patient. The patient was provided an opportunity to ask questions and all were answered. The patient agreed with the plan and demonstrated an understanding of the instructions.   The patient was advised to call back or seek an in-person evaluation if the symptoms worsen or if the condition fails to improve as anticipated.  Total Time spent in visit with the patient was:  15 min, of which 100% of the time was spent in counseling and/or coordinating care.   Pt understands and agrees with the plan of care outlined.     , DO

## 2022-07-27 ENCOUNTER — Ambulatory Visit: Payer: Medicaid Other | Admitting: Rehabilitative and Restorative Service Providers"

## 2022-08-04 ENCOUNTER — Ambulatory Visit: Payer: Medicaid Other | Attending: Family Medicine | Admitting: Physical Therapy

## 2022-08-04 NOTE — Therapy (Incomplete)
OUTPATIENT PHYSICAL THERAPY SHOULDER EVALUATION   Patient Name: Kathryn Hayden MRN: [id number] DOB:Apr 03, 1976, 46 y.o., female Today's Date: 08/04/2022    Past Medical History:  Diagnosis Date   Carpal tunnel syndrome    Diabetes mellitus without complication (HCC)    Iron deficiency anemia    Paresthesias    Past Surgical History:  Procedure Laterality Date   TUBAL LIGATION  2004   Patient Active Problem List   Diagnosis Date Noted   Bilateral carpal tunnel syndrome 10/19/2019   Chronic daily headache 10/19/2019    PCP: Dibas Koirala MD  REFERRING PROVIDER: Darrow Bussing MD   REFERRING DIAG: M25.511 right anterior shoulder pain  THERAPY DIAG:  Right shoulder pain; right shoulder stiffness, weakness  Rationale for Evaluation and Treatment: Rehabilitation  ONSET DATE: ***  SUBJECTIVE:                                                                                                                                                                                      SUBJECTIVE STATEMENT: ***  PERTINENT HISTORY: Carpal tunnel left hand; headaches   PAIN:  PAIN:  Are you having pain? Yes NPRS scale: ***/10 Pain location: ***  Aggravating factors: *** Relieving factors: ***  PRECAUTIONS: None  WEIGHT BEARING RESTRICTIONS: No  FALLS:  Has patient fallen in last 6 months? No  OCCUPATION: ***  PLOF: {PLOF:24004}  PATIENT GOALS:***  NEXT MD VISIT:   OBJECTIVE:   DIAGNOSTIC FINDINGS:  ***  PATIENT SURVEYS:  Quick Dash ***  COGNITION: Overall cognitive status: Within functional limits for tasks assessed     SENSATION: {sensation:27233}  POSTURE: ***  UPPER EXTREMITY ROM:   Active ROM Right eval Left eval  Shoulder flexion    Shoulder extension    Shoulder abduction    Shoulder adduction    Shoulder internal rotation    Shoulder external rotation    Elbow flexion    Elbow extension    Wrist flexion    Wrist extension    Wrist  ulnar deviation    Wrist radial deviation    Wrist pronation    Wrist supination    (Blank rows = not tested)  UPPER EXTREMITY MMT:  MMT Right eval Left eval  Shoulder flexion    Shoulder extension    Shoulder abduction    Shoulder adduction    Shoulder internal rotation    Shoulder external rotation    Middle trapezius    Lower trapezius    Elbow flexion    Elbow extension    Wrist flexion    Wrist extension    Wrist ulnar deviation    Wrist radial deviation  Wrist pronation    Wrist supination    Grip strength (lbs)    (Blank rows = not tested)  SHOULDER SPECIAL TESTS: Impingement tests: Hawkins/Kennedy impingement test: {pos/neg:25230} and Painful arc test: {pos/neg:25230} Rotator cuff assessment: Empty can test: {pos/neg:25230} and External rotation lag sign: {pos/neg:25230} JOINT MOBILITY TESTING:  ***  PALPATION:  ***   TODAY'S TREATMENT:                                                                                                                                         DATE: 08/04/22   PATIENT EDUCATION: Education details: plan of care Person educated: Patient Education method: Medical illustrator Education comprehension: verbalized understanding  HOME EXERCISE PROGRAM: ***  ASSESSMENT:  CLINICAL IMPRESSION: Patient is a 46 y.o. female who was seen today for physical therapy evaluation and treatment for right shoulder pain. The patient would benefit from skilled PT to address shoulder range of motion limitations particularly shoulder elevation and internal rotation as well as glenohumeral, scapular and thoracic joint hypomobility.  The patient would also benefit from strengthening to correct asymmetries including weakness in rotator cuff and periscapular musculature and decreasing overuse of the upper trapezius.  These impairments  and pain contribute to difficulties with home and work ADLS like reaching overhead, lifting objects to higher  shelves, reaching behind the back and with carrying heavier objects.  Sleep is also affected by pain.   OBJECTIVE IMPAIRMENTS: decreased ROM, decreased strength, impaired perceived functional ability, impaired UE functional use, and pain.   ACTIVITY LIMITATIONS: carrying, lifting, and reach over head  PARTICIPATION LIMITATIONS: {participationrestrictions:25113}  PERSONAL FACTORS: {Personal factors:25162} are also affecting patient's functional outcome.   REHAB POTENTIAL: Good  CLINICAL DECISION MAKING: Stable/uncomplicated  EVALUATION COMPLEXITY: Low   GOALS: Goals reviewed with patient? Yes  SHORT TERM GOALS: Target date: 09/01/2022   1.The patient will demonstrate knowledge of basic self care strategies and exercises to promote healing   Baseline: Goal status: INITIAL  2.  The patient will have improved shoulder elevation ROM to at least 150 degrees needed for grooming/dressing purposes as well as reaching high shelves  Baseline:  Goal status: INITIAL  3.  The patient will have grossly 4/5 strength needed to lift and lower a 2-3# object from a high shelf  Baseline:  Goal status: INITIAL  4.  The patient will report a 40% improvement in pain levels with functional activities   Baseline:  Goal status: INITIAL   LONG TERM GOALS: Target date: 09/29/2022    The patient will be independent in a safe self progression of a home exercise program to promote further recovery of function   Baseline:  Goal status: INITIAL  2.  The patient will have improved shoulder elevation ROM to at least 150 degrees needed for grooming/dressing purposes as well as reaching high shelves  Baseline:  Goal status: INITIAL  3.  The patient will  have grossly 4+/5 strength needed to lift and lower a 5-8# object from a high shelf  Baseline:  Goal status: INITIAL  4.  The patient will report a 75% improvement in pain levels with functional activities which are currently difficult including   Baseline:  Goal status: INITIAL  5.  Quick DASH score improved to    indicating improved function with less pain Baseline:  Goal status: INITIAL  6.  *** Baseline:  Goal status: INITIAL  PLAN:  PT FREQUENCY: {rehab frequency:25116}  PT DURATION: 8 weeks  PLANNED INTERVENTIONS: Therapeutic exercises, Therapeutic activity, Neuromuscular re-education, Gait training, Patient/Family education, Self Care, Joint mobilization, Aquatic Therapy, Dry Needling, Electrical stimulation, Cryotherapy, Moist heat, Taping, Ionotophoresis 4mg /ml Dexamethasone, Manual therapy, and Re-evaluation  PLAN FOR NEXT SESSION: ***

## 2024-03-23 ENCOUNTER — Ambulatory Visit (INDEPENDENT_AMBULATORY_CARE_PROVIDER_SITE_OTHER)

## 2024-03-23 ENCOUNTER — Ambulatory Visit (INDEPENDENT_AMBULATORY_CARE_PROVIDER_SITE_OTHER): Admitting: Orthopaedic Surgery

## 2024-03-23 DIAGNOSIS — M25562 Pain in left knee: Secondary | ICD-10-CM

## 2024-03-23 DIAGNOSIS — G8929 Other chronic pain: Secondary | ICD-10-CM | POA: Diagnosis not present

## 2024-03-23 NOTE — Progress Notes (Signed)
 Chief Complaint: Left knee pain     History of Present Illness:    Kathryn Hayden is a 48 y.o. female with left knee pain which has been ongoing for the last year.  She has been experiencing on and off swelling about the medial joint line with difficulty placing weight on the knee.  This is inhibiting her ability to stay active.  She has trialed activity restriction as well as anti-inflammatories without any relief.    PMH/PSH/Family History/Social History/Meds/Allergies:    Past Medical History:  Diagnosis Date   Carpal tunnel syndrome    Diabetes mellitus without complication (HCC)    Iron deficiency anemia    Paresthesias    Past Surgical History:  Procedure Laterality Date   TUBAL LIGATION  2004   Social History   Socioeconomic History   Marital status: Single    Spouse name: Not on file   Number of children: 3   Years of education: Not on file   Highest education level: Some college, no degree  Occupational History   Occupation: CNA  Tobacco Use   Smoking status: Never   Smokeless tobacco: Never  Vaping Use   Vaping status: Never Used  Substance and Sexual Activity   Alcohol use: No    Alcohol/week: 0.0 standard drinks of alcohol   Drug use: Yes    Types: Marijuana   Sexual activity: Not on file  Other Topics Concern   Not on file  Social History Narrative   Lives with 2 daughters in a 3 story home.  Has 3 daughters.     Works as a Lawyer.     Education: some college.   Social Drivers of Corporate investment banker Strain: Not on file  Food Insecurity: Not on file  Transportation Needs: Not on file  Physical Activity: Not on file  Stress: Not on file  Social Connections: Unknown (01/30/2022)   Received from [facility]   Social Network    Social Network: Not on file   Family History  Problem Relation Age of Onset   High blood pressure Mother    Diabetes Father    High blood pressure Father    Healthy Brother    Gout Maternal Grandmother     Diabetes Maternal Grandmother    Hypertension Maternal Grandmother    Healthy Daughter    Other Daughter        Intellectual disability due to prematurity   Allergies  Allergen Reactions   Acetaminophen Itching   Current Outpatient Medications  Medication Sig Dispense Refill   topiramate  (TOPAMAX ) 50 MG tablet Take half-tab in the morning and 1 tablet at bedtime. 135 tablet 3   No current facility-administered medications for this visit.   No results found.  Review of Systems:   A ROS was performed including pertinent positives and negatives as documented in the HPI.  Physical Exam :   Constitutional: NAD and appears stated age Neurological: Alert and oriented Psych: Appropriate affect and cooperative There were no vitals taken for this visit.   Comprehensive Musculoskeletal Exam:    Tenderness about the medial joint line with range of motion from 0 to 120 degrees.  She has good quadricep bulk with an effusion and an antalgic gait with a positive McMurray   Imaging:   Xray (4 views left knee): Normal   I personally reviewed and interpreted the radiographs.   Assessment and Plan:   48 y.o. female with left knee pain consistent with  a meniscal root injury.  At this time I did discuss that the standard of care would be acute treatment given the fact she does have limited weightbearing significant swelling and a positive McMurray.  Will plan to obtain an MRI of her left knee and I will see her back to discuss results  -Plan for MRI left knee and follow-up discuss results   I personally saw and evaluated the patient, and participated in the management and treatment plan.  Elspeth Parker, MD Attending Physician, Orthopedic Surgery  This document was dictated using Dragon voice recognition software. A reasonable attempt at proof reading has been made to minimize errors.

## 2024-04-18 ENCOUNTER — Ambulatory Visit (HOSPITAL_BASED_OUTPATIENT_CLINIC_OR_DEPARTMENT_OTHER): Admitting: Orthopaedic Surgery

## 2024-04-27 ENCOUNTER — Ambulatory Visit (HOSPITAL_COMMUNITY): Admission: RE | Admit: 2024-04-27 | Source: Ambulatory Visit

## 2024-06-12 ENCOUNTER — Ambulatory Visit (HOSPITAL_COMMUNITY): Payer: Self-pay

## 2024-06-13 ENCOUNTER — Other Ambulatory Visit (HOSPITAL_COMMUNITY): Payer: Self-pay | Admitting: Family Medicine

## 2024-06-13 DIAGNOSIS — M79605 Pain in left leg: Secondary | ICD-10-CM

## 2024-06-14 ENCOUNTER — Ambulatory Visit (HOSPITAL_COMMUNITY)
Admission: RE | Admit: 2024-06-14 | Discharge: 2024-06-14 | Disposition: A | Discharge status: Home / Self Care | Source: Ambulatory Visit | Attending: Vascular Surgery | Admitting: Vascular Surgery

## 2024-06-14 DIAGNOSIS — M79605 Pain in left leg: Secondary | ICD-10-CM | POA: Insufficient documentation
